# Patient Record
Sex: Female | Born: 1975 | Race: Black or African American | Hispanic: No | Marital: Married | Smoking: Never smoker
Health system: Southern US, Community
[De-identification: ages and names within clinical notes are randomized; demographics above are authoritative.]

## PROBLEM LIST (undated history)

## (undated) HISTORY — PX: BREAST EXCISIONAL BIOPSY: SUR124

## (undated) HISTORY — PX: ABDOMINAL HYSTERECTOMY: SHX81

## (undated) HISTORY — PX: OTHER SURGICAL HISTORY: SHX169

---

## 1999-11-10 ENCOUNTER — Other Ambulatory Visit: Admission: RE | Admit: 1999-11-10 | Discharge: 1999-11-10 | Payer: Self-pay | Admitting: Obstetrics and Gynecology

## 2000-07-04 ENCOUNTER — Emergency Department (HOSPITAL_COMMUNITY): Admission: EM | Admit: 2000-07-04 | Discharge: 2000-07-04 | Payer: Self-pay | Admitting: Emergency Medicine

## 2000-11-24 ENCOUNTER — Emergency Department (HOSPITAL_COMMUNITY): Admission: EM | Admit: 2000-11-24 | Discharge: 2000-11-24 | Payer: Self-pay

## 2000-12-29 ENCOUNTER — Other Ambulatory Visit: Admission: RE | Admit: 2000-12-29 | Discharge: 2000-12-29 | Payer: Self-pay | Admitting: Obstetrics & Gynecology

## 2000-12-29 ENCOUNTER — Other Ambulatory Visit: Admission: RE | Admit: 2000-12-29 | Discharge: 2000-12-29 | Payer: Self-pay | Admitting: *Deleted

## 2001-01-31 ENCOUNTER — Observation Stay (HOSPITAL_COMMUNITY): Admission: AD | Admit: 2001-01-31 | Discharge: 2001-02-01 | Payer: Self-pay | Admitting: *Deleted

## 2001-07-21 ENCOUNTER — Inpatient Hospital Stay (HOSPITAL_COMMUNITY): Admission: AD | Admit: 2001-07-21 | Discharge: 2001-07-23 | Payer: Self-pay | Admitting: Obstetrics and Gynecology

## 2001-07-21 ENCOUNTER — Encounter (INDEPENDENT_AMBULATORY_CARE_PROVIDER_SITE_OTHER): Payer: Self-pay | Admitting: Specialist

## 2001-08-20 ENCOUNTER — Inpatient Hospital Stay (HOSPITAL_COMMUNITY): Admission: AD | Admit: 2001-08-20 | Discharge: 2001-08-20 | Payer: Self-pay | Admitting: Obstetrics & Gynecology

## 2001-09-21 ENCOUNTER — Other Ambulatory Visit: Admission: RE | Admit: 2001-09-21 | Discharge: 2001-09-21 | Payer: Self-pay | Admitting: Obstetrics & Gynecology

## 2004-10-28 ENCOUNTER — Encounter: Admission: RE | Admit: 2004-10-28 | Discharge: 2004-10-28 | Payer: Self-pay | Admitting: Obstetrics & Gynecology

## 2006-06-20 ENCOUNTER — Emergency Department (HOSPITAL_COMMUNITY): Admission: EM | Admit: 2006-06-20 | Discharge: 2006-06-20 | Payer: Self-pay | Admitting: Emergency Medicine

## 2006-06-24 ENCOUNTER — Emergency Department (HOSPITAL_COMMUNITY): Admission: EM | Admit: 2006-06-24 | Discharge: 2006-06-24 | Payer: Self-pay | Admitting: Family Medicine

## 2008-12-20 ENCOUNTER — Encounter (INDEPENDENT_AMBULATORY_CARE_PROVIDER_SITE_OTHER): Payer: Self-pay | Admitting: Obstetrics and Gynecology

## 2008-12-20 ENCOUNTER — Ambulatory Visit (HOSPITAL_COMMUNITY): Admission: RE | Admit: 2008-12-20 | Discharge: 2008-12-20 | Payer: Self-pay | Admitting: Obstetrics and Gynecology

## 2009-10-20 ENCOUNTER — Emergency Department (HOSPITAL_COMMUNITY): Admission: EM | Admit: 2009-10-20 | Discharge: 2009-10-20 | Payer: Self-pay | Admitting: Emergency Medicine

## 2010-02-13 ENCOUNTER — Ambulatory Visit (HOSPITAL_COMMUNITY): Admission: RE | Admit: 2010-02-13 | Discharge: 2010-02-14 | Payer: Self-pay | Admitting: Obstetrics and Gynecology

## 2010-02-13 ENCOUNTER — Encounter (INDEPENDENT_AMBULATORY_CARE_PROVIDER_SITE_OTHER): Payer: Self-pay | Admitting: Obstetrics and Gynecology

## 2010-09-07 ENCOUNTER — Emergency Department (HOSPITAL_COMMUNITY)
Admission: EM | Admit: 2010-09-07 | Discharge: 2010-09-07 | Payer: Self-pay | Source: Home / Self Care | Admitting: Emergency Medicine

## 2010-12-08 LAB — URINALYSIS, ROUTINE W REFLEX MICROSCOPIC
Hgb urine dipstick: NEGATIVE
Urobilinogen, UA: 1 mg/dL (ref 0.0–1.0)

## 2010-12-08 LAB — CBC
MCH: 28.3 pg (ref 26.0–34.0)
Platelets: 205 10*3/uL (ref 150–400)
RBC: 4.06 MIL/uL (ref 3.87–5.11)
RDW: 13.5 % (ref 11.5–15.5)
WBC: 9.7 10*3/uL (ref 4.0–10.5)

## 2010-12-08 LAB — COMPREHENSIVE METABOLIC PANEL
ALT: 11 U/L (ref 0–35)
AST: 17 U/L (ref 0–37)
Alkaline Phosphatase: 71 U/L (ref 39–117)
BUN: 13 mg/dL (ref 6–23)
Calcium: 8.9 mg/dL (ref 8.4–10.5)
Creatinine, Ser: 0.98 mg/dL (ref 0.4–1.2)
GFR calc non Af Amer: >60 mL/min (ref >60)
Sodium: 138 mEq/L (ref 135–145)
Total Bilirubin: 0.5 mg/dL (ref 0.3–1.2)

## 2010-12-08 LAB — DIFFERENTIAL
Lymphocytes Relative: 33 % (ref 12–46)
Monocytes Absolute: 0.6 10*3/uL (ref 0.1–1.0)
Monocytes Relative: 6 % (ref 3–12)
Neutro Abs: 5.8 10*3/uL (ref 1.7–7.7)

## 2010-12-08 LAB — POCT PREGNANCY, URINE: Preg Test, Ur: NEGATIVE

## 2010-12-14 LAB — CBC
HCT: 37.5 % (ref 36.0–46.0)
Hemoglobin: 11.3 g/dL — ABNORMAL LOW (ref 12.0–15.0)
Hemoglobin: 12.6 g/dL (ref 12.0–15.0)
MCHC: 33.6 g/dL (ref 30.0–36.0)
MCHC: 33.7 g/dL (ref 30.0–36.0)
MCV: 87.1 fL (ref 78.0–100.0)
RBC: 4.31 MIL/uL (ref 3.87–5.11)
RDW: 12.8 % (ref 11.5–15.5)
WBC: 18.4 10*3/uL — ABNORMAL HIGH (ref 4.0–10.5)

## 2010-12-14 LAB — COMPREHENSIVE METABOLIC PANEL
ALT: 13 U/L (ref 0–35)
Alkaline Phosphatase: 64 U/L (ref 39–117)
Creatinine, Ser: 0.87 mg/dL (ref 0.4–1.2)
GFR calc Af Amer: >60 mL/min (ref >60)
Glucose, Bld: 85 mg/dL (ref 70–99)
Total Bilirubin: 0.6 mg/dL (ref 0.3–1.2)

## 2010-12-14 LAB — HCG, SERUM, QUALITATIVE: Preg, Serum: NEGATIVE

## 2011-01-07 LAB — CBC
MCHC: 33.1 g/dL (ref 30.0–36.0)
RDW: 15.1 % (ref 11.5–15.5)

## 2011-01-07 LAB — HCG, SERUM, QUALITATIVE: Preg, Serum: NEGATIVE

## 2011-02-09 NOTE — Op Note (Signed)
[name], [name]               ACCOUNT NO.:  [id number]   MEDICAL RECORD NO.:  [id number]          PATIENT TYPE:  AMB   LOCATION:  SDC                           FACILITY:  [facility]   PHYSICIAN:  Lenoard Aden, M.D.DATE OF BIRTH:  1976/08/30   DATE OF PROCEDURE:  12/20/2008  DATE OF DISCHARGE:                               OPERATIVE REPORT   PREOPERATIVE DIAGNOSIS:  Menorrhagia.   POSTOPERATIVE DIAGNOSIS:  Menorrhagia.   PROCEDURES:  Diagnostic hysteroscopy, dilation and curettage, and  NovaSure endometrial ablation.   SURGEON:  Lenoard Aden, MD   ANESTHESIA:  General and local.   ESTIMATED BLOOD LOSS:  Less than 50 mL.   FLUID DEFICIT:  50 mL.   COMPLICATIONS:  None.   DRAINS:  None.   COUNTS:  Correct.   The patient recovered in good condition.   SPECIMEN:  Endometrial curettings to Pathology.   BRIEF OPERATIVE NOTE:  After being apprised the risks of anesthesia,  infection, bleeding, uterine perforation, and possible need for repair,  the patient was brought to the operating room where she was administered  general anesthetic without complications, prepped and draped in the  usual sterile fashion.  Feet were placed in the Yellofin stirrups.  Exam  under anesthesia revealed a bulky anteflexed uterus and no adnexal  masses.  Cervix was easily dilated up to #25 [facility] dilator.  Cervical  length of 3.  Uterine sounds to 9.  Hysteroscope after dilute Pitressin  and Marcaine solutions was placed.  Pitressin solution at 3 and 9  o'clock at the cervicovaginal junction 16 mL total.  Marcaine solution  20 mL standard paracervical block at 4 and 8 o'clock.  At this time,  hysteroscope has been placed.  Visualization reveals an otherwise  normal, but thickened endometrial cavity.  Hysteroscope was removed.  Endometrial curettings were collected in a normal 4-quadrant method  using sharp curettage.  Repeat hysteroscopy reveals an otherwise normal  cavity with normal  tubal ostia.  At this time, NovaSure device was  placed, seated in the appropriate fashion to a width of 4.5, and CO2  test was initiated and is negative.  At this time, NovaSure device was  engaged to a power of 149 for 90 seconds and removed without difficulty  at the end of the procedure.  Visualization  then reveals an otherwise normal, but well ablated endometrial cavity  except for a small stripe of endometrium along the fundus.  Instrument  was then removed noting no evidence of uterine perforation.  The patient  is awakened,  she tolerated the procedure well and was transferred to  recovery in good condition.      Lenoard Aden, M.D.  Electronically Signed     RJT/MEDQ  D:  12/20/2008  T:  12/20/2008  Job:  [id number]

## 2011-02-12 NOTE — H&P (Signed)
[facility]  Patient:    [name], [name] Visit Number: [id number] MRN: [id number]          Service Type: Attending:  Lenoard Aden, M.D. Dictated by:   Lenoard Aden, M.D. Adm. Date:  07/21/01   CC:         [facility]                         History and Physical  CHIEF COMPLAINT:              Spontaneous rupture of membranes at 7 a.m.  HISTORY OF PRESENT ILLNESS:   The patient is a 35 year old African-American female G2, P1 at 10 weeks who presents with spontaneous rupture of membranes.  ALLERGIES:                    None.  MEDICATIONS:                  Prenatal vitamins.  PREGNANCY HISTORY:            Pregnancy complicated by questionable IUGR with normal interval growth noted.  PAST OBSTETRICAL HISTORY:     An 8 pound 9 ounce female born 56.  History of benign breast lumpectomy.  History of broken finger with surgery.  FAMILY HISTORY:               Questionable thrombophilia and lupus and myocardial infarction.  PRENATAL LABORATORY DATA:     Blood type B-, Rh antibody negative, sickle cell trait negative.  Hemoglobin electrophoresis normal.  Rubella immune, hepatitis B surface antigen negative, HIV nonreactive.  GC and chlamydia negative.  GBS is positive.  PHYSICAL EXAMINATION:  GENERAL:                      She is a well-developed, well-nourished African-American female in no apparent distress.  HEENT:                        Normal.  LUNGS:                        Clear.  HEART:                        Regular rate and rhythm.  ABDOMEN:                      Soft, gravid, nontender.  Estimated fetal weight 7 pounds.  The cervix, per maternity admissions, is 1 cm, 70%, vertex, and -1.  IMPRESSION:                   1. A 39-week intrauterine pregnancy.                               2. History of intrauterine growth retardation.                               3. Rh negative.                               4. Spontaneous  rupture of membranes.  5. Group B Strep positive.  PLAN:                         Proceed with Pitocin augmentation.  Anticipate attempts at a vaginal delivery.  Penicillin intrapartum prophylaxis ordered. Dictated by:   Lenoard Aden, M.D. Attending:  Lenoard Aden, M.D. DD:  07/21/01 TD:  07/21/01 Job: [id number]

## 2011-02-12 NOTE — H&P (Signed)
[facility]  Patient:    [name], [name]                      MRN: [id number] Adm. Date:  [id number] Disc. Date: [id number] Attending:  Marina Gravel B                         History and Physical  CHIEF COMPLAINT:              Nausea, vomiting, first trimester pregnancy.  HISTORY OF PRESENT ILLNESS:   Patient is a 35 year old white female gravida 2, para 1 who was sent to maternity admissions with complaints of severe nausea/vomiting and associated headache.  She was treated with intravenous antibiotics per the hyperemesis protocol, however, she had continued problems with nausea and vomiting and a severe headache.  Patient was therefore admitted overnight for observation and continued IV fluids, antiemetics, and medication for the headache.  PAST MEDICAL HISTORY:         History of occasional headaches, otherwise negative.  PAST SURGICAL HISTORY:        Right breast lump removal, right finger surgery.  PAST OBSTETRICAL HISTORY:     Previous spontaneous vaginal delivery.  FAMILY HISTORY:               Noncontributory.  MEDICATIONS:                  Prenatal vitamins.  ALLERGIES:                    None.  SOCIAL HISTORY:               No alcohol, tobacco, or other drugs.  REVIEW OF SYSTEMS:            Otherwise noncontributory and is documented in the intake health history form.  PHYSICAL EXAMINATION  VITAL SIGNS:                  Temperature 97.8, pulse 90, respirations 20, blood pressure 138/66.  GENERAL:                      The patient is alert and oriented with no acute distress.  She does complain of a mild headache and continuing nausea and vomiting.  HEART:                        Regular rate and rhythm.  LUNGS:                        Clear to auscultation.  ABDOMEN:                      Soft, nontender.  PELVIC:                       Vaginal examination deferred.  ASSESSMENT:                   A 14 week intrauterine  pregnancy with hyperemesis and associated headache.  PLAN:                         Admission overnight for observation for intravenous fluids and antiemetics and intravenous Demerol for headache. DD:  02/18/01 TD:  02/18/01 Job: [id number]

## 2011-02-12 NOTE — Op Note (Signed)
[facility]  Patient:    AIJALON, DEMURO Visit Number: [id number] MRN: [id number]          Service Type: OBS Location: 910A 9130 01 Attending Physician:  Lenoard Aden Dictated by:   Silverio Lay, M.D. Proc. Date: 07/22/01 Admit Date:  07/21/2001 Discharge Date: 07/23/2001                             Operative Report  PREOPERATIVE DIAGNOSIS:       Desire for sterilization.  POSTOPERATIVE DIAGNOSIS:      Desire for sterilization.  PROCEDURE:                    Postpartum bilateral tubal ligation.  SURGEON:                      Silverio Lay, M.D.  ANESTHESIA:                   Epidural.  ESTIMATED BLOOD LOSS:         Minimal.  DESCRIPTION OF PROCEDURE:     After being informed of the planned procedure with the possible complications including bleeding, infection, bowel injury, irreversibility and failure rate of 1/250 to 1/500, informed consent was obtained.  The patient was taken to OR #4 and given epidural anesthesia through the already-placed labor catheter.  She was prepped and draped in a sterile fashion.  After assessing an adequate level of anesthesia, we proceeded with an umbilical incision of 3 cm and bluntly dissected our way through the fascia.  The fascia was then grasped with Allis forceps and opened with scissors.  The peritoneum was opened bluntly.  We proceeded with tubal ligation on both sides with tying twice each pedicle with 0 chromic and removing 1.5 cm of tube.  Each pedicle was then cauterized with Bovie. Hemostasis was adequate.  We proceeded with closure of the fascia with a running suture of 0 Vicryl.  The skin was closed with subcuticular 4-0 Vicryl. Sponge and instrument counts were complete x 2.  Estimated blood loss was minimal.  The procedure was well tolerated by the patient, who was taken to the recovery room in well and stable condition. Dictated by:   Silverio Lay, M.D. Attending Physician:  Lenoard Aden DD:  07/22/01 TD:  07/24/01 Job: [id number]

## 2011-07-02 ENCOUNTER — Emergency Department (HOSPITAL_COMMUNITY): Payer: No Typology Code available for payment source

## 2011-07-02 ENCOUNTER — Inpatient Hospital Stay (HOSPITAL_COMMUNITY)
Admission: EM | Admit: 2011-07-02 | Discharge: 2011-07-04 | DRG: 494 | Disposition: A | Discharge status: Home / Self Care | Payer: No Typology Code available for payment source | Attending: Orthopaedic Surgery | Admitting: Orthopaedic Surgery

## 2011-07-02 DIAGNOSIS — Y92009 Unspecified place in unspecified non-institutional (private) residence as the place of occurrence of the external cause: Secondary | ICD-10-CM

## 2011-07-02 DIAGNOSIS — S82209A Unspecified fracture of shaft of unspecified tibia, initial encounter for closed fracture: Principal | ICD-10-CM | POA: Diagnosis present

## 2011-07-02 DIAGNOSIS — Y998 Other external cause status: Secondary | ICD-10-CM

## 2011-07-02 LAB — POCT I-STAT, CHEM 8
Calcium, Ion: 1.1 mmol/L — ABNORMAL LOW (ref 1.12–1.32)
Chloride: 105 mEq/L (ref 96–112)
Glucose, Bld: 133 mg/dL — ABNORMAL HIGH (ref 70–99)
HCT: 37 % (ref 36.0–46.0)
Hemoglobin: 12.6 g/dL (ref 12.0–15.0)
Potassium: 3.4 mEq/L — ABNORMAL LOW (ref 3.5–5.1)
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
## 2015-03-21 ENCOUNTER — Encounter (HOSPITAL_COMMUNITY): Payer: Self-pay | Admitting: Emergency Medicine

## 2015-03-21 ENCOUNTER — Emergency Department (HOSPITAL_COMMUNITY)
Admission: EM | Admit: 2015-03-21 | Discharge: 2015-03-22 | Disposition: A | Discharge status: Home / Self Care | Payer: BC Managed Care – PPO | Attending: Emergency Medicine | Admitting: Emergency Medicine

## 2015-03-21 ENCOUNTER — Emergency Department (HOSPITAL_COMMUNITY): Payer: BC Managed Care – PPO

## 2015-03-21 DIAGNOSIS — W228XXA Striking against or struck by other objects, initial encounter: Secondary | ICD-10-CM | POA: Insufficient documentation

## 2015-03-21 DIAGNOSIS — S6991XA Unspecified injury of right wrist, hand and finger(s), initial encounter: Secondary | ICD-10-CM | POA: Diagnosis present

## 2015-03-21 DIAGNOSIS — Y998 Other external cause status: Secondary | ICD-10-CM | POA: Insufficient documentation

## 2015-03-21 DIAGNOSIS — S61431A Puncture wound without foreign body of right hand, initial encounter: Secondary | ICD-10-CM | POA: Diagnosis not present

## 2015-03-21 DIAGNOSIS — Y9301 Activity, walking, marching and hiking: Secondary | ICD-10-CM | POA: Insufficient documentation

## 2015-03-21 DIAGNOSIS — Y9259 Other trade areas as the place of occurrence of the external cause: Secondary | ICD-10-CM | POA: Insufficient documentation

## 2015-03-21 DIAGNOSIS — Z23 Encounter for immunization: Secondary | ICD-10-CM | POA: Insufficient documentation
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
## 2015-03-22 DIAGNOSIS — S61431A Puncture wound without foreign body of right hand, initial encounter: Secondary | ICD-10-CM | POA: Diagnosis not present

## 2015-03-22 MED ORDER — TETANUS-DIPHTH-ACELL PERTUSSIS 5-2.5-18.5 LF-MCG/0.5 IM SUSP
0.5000 mL | Freq: Once | INTRAMUSCULAR | Status: AC
  Administered 2015-03-22: 0.5 mL via INTRAMUSCULAR
  Filled 2015-03-22: qty 0.5

## 2015-03-22 MED ORDER — CEPHALEXIN 500 MG PO CAPS: 500.0000 mg | ORAL_CAPSULE | Freq: Four times a day (QID) | ORAL | Status: DC

## 2015-03-22 MED ORDER — NAPROXEN 500 MG PO TABS: 500.0000 mg | ORAL_TABLET | Freq: Two times a day (BID) | ORAL | Status: DC

## 2015-03-22 NOTE — Discharge Instructions (Signed)
Puncture Wound °A puncture wound is an injury that extends through all layers of the skin and into the tissue beneath the skin (subcutaneous tissue). Puncture wounds become infected easily because germs often enter the body and go beneath the skin during the injury. Having a deep wound with a small entrance point makes it difficult for your caregiver to adequately clean the wound. This is especially true if you have stepped on a nail and it has passed through a dirty shoe or other situations where the wound is obviously contaminated. °CAUSES  °Many puncture wounds involve glass, nails, splinters, fish hooks, or other objects that enter the skin (foreign bodies). A puncture wound may also be caused by a human bite or animal bite. °DIAGNOSIS  °A puncture wound is usually diagnosed by your history and a physical exam. You may need to have an X-ray or an ultrasound to check for any foreign bodies still in the wound. °TREATMENT  °· Your caregiver will clean the wound as thoroughly as possible. Depending on the location of the wound, a bandage (dressing) may be applied. °· Your caregiver might prescribe antibiotic medicines. °· You may need a follow-up visit to check on your wound. Follow all instructions as directed by your caregiver. °HOME CARE INSTRUCTIONS  °· Change your dressing once per day, or as directed by your caregiver. If the dressing sticks, it may be removed by soaking the area in water. °· If your caregiver has given you follow-up instructions, it is very important that you return for a follow-up appointment. Not following up as directed could result in a chronic or permanent injury, pain, and disability. °· Only take over-the-counter or prescription medicines for pain, discomfort, or fever as directed by your caregiver. °· If you are given antibiotics, take them as directed. Finish them even if you start to feel better. °You may need a tetanus shot if: °· You cannot remember when you had your last tetanus  shot. °· You have never had a tetanus shot. °If you got a tetanus shot, your arm may swell, get red, and feel warm to the touch. This is common and not a problem. If you need a tetanus shot and you choose not to have one, there is a rare chance of getting tetanus. Sickness from tetanus can be serious. °You may need a rabies shot if an animal bite caused your puncture wound. °SEEK MEDICAL CARE IF:  °· You have redness, swelling, or increasing pain in the wound. °· You have red streaks going away from the wound. °· You notice a bad smell coming from the wound or dressing. °· You have yellowish-white fluid (pus) coming from the wound. °· You are treated with an antibiotic for infection, but the infection is not getting better. °· You notice something in the wound, such as rubber from your shoe, cloth, or another object. °· You have a fever. °· You have severe pain. °· You have difficulty breathing. °· You feel dizzy or faint. °· You cannot stop vomiting. °· You lose feeling, develop numbness, or cannot move a limb below the wound. °· Your symptoms worsen. °MAKE SURE YOU: °· Understand these instructions. °· Will watch your condition. °· Will get help right away if you are not doing well or get worse. °Document Released: 06/23/2005 Document Revised: 12/06/2011 Document Reviewed: 03/02/2011 °Exit[employer]® Patient Information ©2015 [employer]. This information is not intended to replace advice given to you by your health care provider. Make sure you discuss any questions you   have with your health care provider. ° °

## 2015-08-03 ENCOUNTER — Emergency Department (HOSPITAL_COMMUNITY)
Admission: EM | Admit: 2015-08-03 | Discharge: 2015-08-03 | Disposition: A | Discharge status: Home / Self Care | Payer: BC Managed Care – PPO | Attending: Physician Assistant | Admitting: Physician Assistant

## 2015-08-03 ENCOUNTER — Encounter (HOSPITAL_COMMUNITY): Payer: Self-pay | Admitting: Emergency Medicine

## 2015-08-03 DIAGNOSIS — H9201 Otalgia, right ear: Secondary | ICD-10-CM | POA: Diagnosis present

## 2015-08-03 DIAGNOSIS — Z791 Long term (current) use of non-steroidal anti-inflammatories (NSAID): Secondary | ICD-10-CM | POA: Insufficient documentation

## 2015-08-03 DIAGNOSIS — R590 Localized enlarged lymph nodes: Secondary | ICD-10-CM | POA: Diagnosis not present

## 2015-08-03 DIAGNOSIS — Z792 Long term (current) use of antibiotics: Secondary | ICD-10-CM | POA: Insufficient documentation

## 2015-08-03 MED ORDER — AZITHROMYCIN 250 MG PO TABS: 250.0000 mg | ORAL_TABLET | Freq: Every day | ORAL | Status: DC

## 2015-08-03 MED ORDER — CETIRIZINE HCL 10 MG PO CAPS: 10.0000 mg | ORAL_CAPSULE | Freq: Every day | ORAL | Status: DC

## 2015-08-03 MED ORDER — IBUPROFEN 800 MG PO TABS
800.0000 mg | ORAL_TABLET | Freq: Once | ORAL | Status: AC
  Administered 2015-08-03: 800 mg via ORAL
  Filled 2015-08-03: qty 1

## 2015-08-03 MED ORDER — AZITHROMYCIN 250 MG PO TABS
500.0000 mg | ORAL_TABLET | Freq: Once | ORAL | Status: AC
  Administered 2015-08-03: 500 mg via ORAL
  Filled 2015-08-03: qty 2

## 2015-08-03 NOTE — ED Provider Notes (Signed)
CSN: [id number]     Arrival date & time 08/03/15  1910 History  By signing my name below, I, Soijett Blue, attest that this documentation has been prepared under the direction and in the presence of Elpidio Anis, PA-C Electronically Signed: Soijett Blue, ED Scribe. 08/03/2015. 7:53 PM.   Chief Complaint  Patient presents with  . Otalgia     The history is provided by the patient. No language interpreter was used.    [name] is a 39 y.o. female who presents to the Emergency Department complaining of constant, 6/10, throbbing, right ear pain onset 1 day. She thinks that she has an ear infection and she has not had one in the past. She notes that she has not tried any medications for the relief of her symptoms. She denies fever, chills, ear drainage, cough, nasal congestion, and any other symptoms. Denies allergies to medications.   History reviewed. No pertinent past medical history. Past Surgical History  Procedure Laterality Date  . Partial hysterectomy     No family history on file. Social History  Substance Use Topics  . Smoking status: Never Smoker   . Smokeless tobacco: None  . Alcohol Use: No   OB History    No data available     Review of Systems  Constitutional: Negative for fever and chills.  HENT: Positive for ear pain. Negative for congestion, ear discharge and sinus pressure.   Respiratory: Negative for cough.      Allergies  Review of patient's allergies indicates no known allergies.  Home Medications   Prior to Admission medications   Medication Sig Start Date End Date Taking? Authorizing Provider  cephALEXin (KEFLEX) 500 MG capsule Take 1 capsule (500 mg total) by mouth 4 (four) times daily. 03/22/15   Elpidio Anis, PA-C  naproxen (NAPROSYN) 500 MG tablet Take 1 tablet (500 mg total) by mouth 2 (two) times daily. 03/22/15   Tallulah Hosman, PA-C   BP 142/63 mmHg  Pulse 89  Temp(Src) 98.5 F (36.9 C) (Oral)  Resp 20  SpO2 100% Physical Exam   Constitutional: She is oriented to person, place, and time. She appears well-developed and well-nourished. No distress.  HENT:  Head: Normocephalic and atraumatic.  Left Ear: Tympanic membrane and ear canal normal.  Right TM: erythematous markings. No purulent middle ear effusion. External canal is unremarkable. No pain with ear movment. Left TM nl.  Eyes: EOM are normal.  Neck: Neck supple.  Cardiovascular: Normal rate.   Pulmonary/Chest: Effort normal. No respiratory distress.  Musculoskeletal: Normal range of motion.  Lymphadenopathy:    She has cervical adenopathy.  Neurological: She is alert and oriented to person, place, and time.  Skin: Skin is warm and dry.  Psychiatric: She has a normal mood and affect. Her behavior is normal.  Nursing note and vitals reviewed.   ED Course  Procedures (including critical care time) DIAGNOSTIC STUDIES: Oxygen Saturation is 100% on RA, nl by my interpretation.    COORDINATION OF CARE: 7:53 PM Discussed treatment plan with pt at bedside which includes zithromax Rx and use zyrtec or benadryl PRN and pt agreed to plan.   Labs Review Labs Reviewed - No data to display  Imaging Review No results found.    EKG Interpretation None      MDM   Final diagnoses:  None    1. Right otalgia  Melvenia Beam has symptoms of ear pain without finding of middle ear infection. Consider sinus or allergy component. Will opt to  treat with Zyrtec, Z-pack, PCP follow up.  I personally performed the services described in this documentation, which was scribed in my presence. The recorded information has been reviewed and is accurate.     Elpidio AnisShari Riane Rung, PA-C 08/07/15 2319  Courteney Randall AnLyn Mackuen, MD 08/09/15 [phone]

## 2015-08-03 NOTE — Discharge Instructions (Signed)
Earache An earache, also called otalgia, can be caused by many things. Pain from an earache can be sharp, dull, or burning. The pain may be temporary or constant. Earaches can be caused by problems with the ear, such as infection in either the middle ear or the ear canal, injury, impacted ear wax, middle ear pressure, or a foreign body in the ear. Ear pain can also result from problems in other areas. This is called referred pain. For example, pain can come from a sore throat, a tooth infection, or problems with the jaw or the joint between the jaw and the skull (temporomandibular joint, or TMJ). The cause of an earache is not always easy to identify. Watchful waiting may be appropriate for some earaches until a clear cause of the pain can be found. HOME CARE INSTRUCTIONS Watch your condition for any changes. The following actions may help to lessen any discomfort that you are feeling:  Take medicines only as directed by your health care provider. This includes ear drops.  Apply ice to your outer ear to help reduce pain.  Put ice in a plastic bag.  Place a towel between your skin and the bag.  Leave the ice on for 20 minutes, 2-3 times per day.  Do not put anything in your ear other than medicine that is prescribed by your health care provider.  Try resting in an upright position instead of lying down. This may help to reduce pressure in the middle ear and relieve pain.  Chew gum if it helps to relieve your ear pain.  Control any allergies that you have.  Keep all follow-up visits as directed by your health care provider. This is important. SEEK MEDICAL CARE IF:  Your pain does not improve within 2 days.  You have a fever.  You have new or worsening symptoms. SEEK IMMEDIATE MEDICAL CARE IF:  You have a severe headache.  You have a stiff neck.  You have difficulty swallowing.  You have redness or swelling behind your ear.  You have drainage from your ear.  You have hearing  loss.  You feel dizzy.   This information is not intended to replace advice given to you by your health care provider. Make sure you discuss any questions you have with your health care provider.   Document Released: 04/30/2004 Document Revised: 10/04/2014 Document Reviewed: 04/14/2014 Elsevier Interactive Patient Education 2016 [employer]  

## 2015-08-03 NOTE — ED Notes (Signed)
Patient c/o right ear pain x1 day, described at throbbing, no drainage. Denies fever/chills. Rates pain 6/10.

## 2016-01-12 ENCOUNTER — Encounter (HOSPITAL_BASED_OUTPATIENT_CLINIC_OR_DEPARTMENT_OTHER): Payer: Self-pay | Admitting: Emergency Medicine

## 2016-01-12 ENCOUNTER — Emergency Department (HOSPITAL_BASED_OUTPATIENT_CLINIC_OR_DEPARTMENT_OTHER)
Admission: EM | Admit: 2016-01-12 | Discharge: 2016-01-12 | Disposition: A | Discharge status: Home / Self Care | Payer: BC Managed Care – PPO | Attending: Emergency Medicine | Admitting: Emergency Medicine

## 2016-01-12 ENCOUNTER — Emergency Department (HOSPITAL_BASED_OUTPATIENT_CLINIC_OR_DEPARTMENT_OTHER): Payer: BC Managed Care – PPO

## 2016-01-12 DIAGNOSIS — R51 Headache: Secondary | ICD-10-CM | POA: Insufficient documentation

## 2016-01-12 DIAGNOSIS — R519 Headache, unspecified: Secondary | ICD-10-CM

## 2016-01-12 DIAGNOSIS — R42 Dizziness and giddiness: Secondary | ICD-10-CM | POA: Diagnosis not present

## 2016-01-12 NOTE — ED Notes (Signed)
Pt having headache over one week.  Pt taking otc medications that help but once medications wear off the headache comes back.  Pt also having dizziness, feels like she is in a fog.  Pt states it is having tremors and difficulty to open wide.  No weakness in extremeties.  Some swelling noted per pt which is new.  No vision changes.  No n/v.  Some sensitivity to light.

## 2016-01-12 NOTE — ED Provider Notes (Signed)
CSN: [id number]     Arrival date & time 01/12/16  1005 History   First MD Initiated Contact with Patient 01/12/16 1059     Chief Complaint  Patient presents with  . Headache     (Consider location/radiation/quality/duration/timing/severity/associated sxs/prior Treatment) HPI Comments: Patient is a 40 year old female with no significant past medical history. She presents for evaluation of headache. This started 1 week ago and has not let up. She denies any injury or trauma. She denies any visual disturbances. She denies any nausea. She denies any fevers or stiff neck.  Patient is a 40 y.o. female presenting with headaches. The history is provided by the patient.  Headache Pain location:  Frontal Quality: Throbbing. Radiates to:  Does not radiate Duration:  1 week Timing:  Constant Progression:  Worsening Chronicity:  New Similar to prior headaches: no   Context: bright light   Context: not activity and not caffeine   Relieved by:  Nothing Worsened by:  Nothing Ineffective treatments:  Acetaminophen and NSAIDs Associated symptoms: no blurred vision, no fever and no neck stiffness     No past medical history on file. Past Surgical History  Procedure Laterality Date  . Abdominal hysterectomy    . Arm surgery     No family history on file. Social History  Substance Use Topics  . Smoking status: Never Smoker   . Smokeless tobacco: None  . Alcohol Use: No   OB History    No data available     Review of Systems  Constitutional: Negative for fever.  Eyes: Negative for blurred vision.  Musculoskeletal: Negative for neck stiffness.  Neurological: Positive for headaches.  All other systems reviewed and are negative.     Allergies  Review of patient's allergies indicates no known allergies.  Home Medications   Prior to Admission medications   Not on File   BP 122/74 mmHg  Pulse 66  Temp(Src) 98.4 F (36.9 C) (Oral)  Resp 16  Ht 5\' 4"  (1.626 m)  SpO2  100% Physical Exam  Constitutional: She is oriented to person, place, and time. She appears well-developed and well-nourished. No distress.  HENT:  Head: Normocephalic and atraumatic.  Eyes: EOM are normal. Pupils are equal, round, and reactive to light.  There is no papilledema on funduscopic exam  Neck: Normal range of motion. Neck supple.  Cardiovascular: Normal rate and regular rhythm.  Exam reveals no gallop and no friction rub.   No murmur heard. Pulmonary/Chest: Effort normal and breath sounds normal. No respiratory distress. She has no wheezes.  Abdominal: Soft. Bowel sounds are normal. She exhibits no distension. There is no tenderness.  Musculoskeletal: Normal range of motion.  Neurological: She is alert and oriented to person, place, and time. No cranial nerve deficit. She exhibits normal muscle tone. Coordination normal.  Skin: Skin is warm and dry. She is not diaphoretic.  Nursing note and vitals reviewed.   ED Course  Procedures (including critical care time) Labs Review Labs Reviewed - No data to display  Imaging Review No results found. I have personally reviewed and evaluated these images and lab results as part of my medical decision-making.    MDM   Final diagnoses:  None    Patient is neurologically intact and head ct is unremarkable.  Highly doubt meningitis or bleed.  No indication for LP at this time.  Will discharge with alternating motrin and tylenol.    Geoffery Lyonsouglas Aunna Snooks, MD 01/12/16 [phone]

## 2016-01-12 NOTE — Discharge Instructions (Signed)
Tylenol 1000 mg her dictated with Motrin 600 mg every 4 hours as needed for pain.  Avoid caffeine and alcohol intake.  Follow-up with your primary Dr. if not improving in the next week.   General Headache Without Cause A headache is pain or discomfort felt around the head or neck area. The specific cause of a headache may not be found. There are many causes and types of headaches. A few common ones are:  Tension headaches.  Migraine headaches.  Cluster headaches.  Chronic daily headaches. HOME CARE INSTRUCTIONS  Watch your condition for any changes. Take these steps to help with your condition: Managing Pain  Take over-the-counter and prescription medicines only as told by your health care provider.  Lie down in a dark, quiet room when you have a headache.  If directed, apply ice to the head and neck area:  Put ice in a plastic bag.  Place a towel between your skin and the bag.  Leave the ice on for 20 minutes, 2-3 times per day.  Use a heating pad or hot shower to apply heat to the head and neck area as told by your health care provider.  Keep lights dim if bright lights bother you or make your headaches worse. Eating and Drinking  Eat meals on a regular schedule.  Limit alcohol use.  Decrease the amount of caffeine you drink, or stop drinking caffeine. General Instructions  Keep all follow-up visits as told by your health care provider. This is important.  Keep a headache journal to help find out what may trigger your headaches. For example, write down:  What you eat and drink.  How much sleep you get.  Any change to your diet or medicines.  Try massage or other relaxation techniques.  Limit stress.  Sit up straight, and do not tense your muscles.  Do not use tobacco products, including cigarettes, chewing tobacco, or e-cigarettes. If you need help quitting, ask your health care provider.  Exercise regularly as told by your health care  provider.  Sleep on a regular schedule. Get 7-9 hours of sleep, or the amount recommended by your health care provider. SEEK MEDICAL CARE IF:   Your symptoms are not helped by medicine.  You have a headache that is different from the usual headache.  You have nausea or you vomit.  You have a fever. SEEK IMMEDIATE MEDICAL CARE IF:   Your headache becomes severe.  You have repeated vomiting.  You have a stiff neck.  You have a loss of vision.  You have problems with speech.  You have pain in the eye or ear.  You have muscular weakness or loss of muscle control.  You lose your balance or have trouble walking.  You feel faint or pass out.  You have confusion.   This information is not intended to replace advice given to you by your health care provider. Make sure you discuss any questions you have with your health care provider.   Document Released: 09/13/2005 Document Revised: 06/04/2015 Document Reviewed: 01/06/2015 Elsevier Interactive Patient Education [employer] Inc[employer]

## 2019-04-17 ENCOUNTER — Other Ambulatory Visit: Payer: Self-pay

## 2019-04-17 DIAGNOSIS — Z20822 Contact with and (suspected) exposure to covid-19: Secondary | ICD-10-CM

## 2019-04-19 LAB — NOVEL CORONAVIRUS, NAA: SARS-CoV-2, NAA: NOT DETECTED

## 2019-04-26 ENCOUNTER — Other Ambulatory Visit: Payer: Self-pay

## 2019-04-26 DIAGNOSIS — Z20822 Contact with and (suspected) exposure to covid-19: Secondary | ICD-10-CM

## 2019-05-01 LAB — NOVEL CORONAVIRUS, NAA: SARS-CoV-2, NAA: DETECTED — AB

## 2020-03-06 ENCOUNTER — Other Ambulatory Visit: Payer: Self-pay | Admitting: Obstetrics and Gynecology

## 2020-03-06 DIAGNOSIS — N644 Mastodynia: Secondary | ICD-10-CM

## 2020-03-06 DIAGNOSIS — N63 Unspecified lump in unspecified breast: Secondary | ICD-10-CM

## 2020-03-21 ENCOUNTER — Ambulatory Visit
Admission: RE | Admit: 2020-03-21 | Discharge: 2020-03-21 | Disposition: A | Discharge status: Home / Self Care | Payer: BC Managed Care – PPO | Source: Ambulatory Visit | Attending: Obstetrics and Gynecology | Admitting: Obstetrics and Gynecology

## 2020-03-21 ENCOUNTER — Ambulatory Visit
Admission: RE | Admit: 2020-03-21 | Discharge: 2020-03-21 | Disposition: A | Discharge status: Home / Self Care | Payer: PRIVATE HEALTH INSURANCE | Source: Ambulatory Visit | Attending: Obstetrics and Gynecology | Admitting: Obstetrics and Gynecology

## 2020-03-21 ENCOUNTER — Other Ambulatory Visit: Payer: Self-pay

## 2020-03-21 DIAGNOSIS — N63 Unspecified lump in unspecified breast: Secondary | ICD-10-CM

## 2020-03-21 DIAGNOSIS — N644 Mastodynia: Secondary | ICD-10-CM

## 2020-11-08 IMAGING — MG DIGITAL DIAGNOSTIC BILAT W/ TOMO W/ CAD
6 of 10 series · 6 of 30 positions shown · non-contrast
Comparison: Previous exam(s).

CLINICAL DATA: Patient has tenderness in the LATERAL portion of the
RIGHT breast. She palpates a mass in the UPPER central portion of
the RIGHT breast. History of benign excisional biopsy in the RIGHT
breast.

EXAM:
DIGITAL DIAGNOSTIC BILATERAL MAMMOGRAM WITH CAD AND TOMO
ULTRASOUND RIGHT BREAST

[R TAN synth-2D]
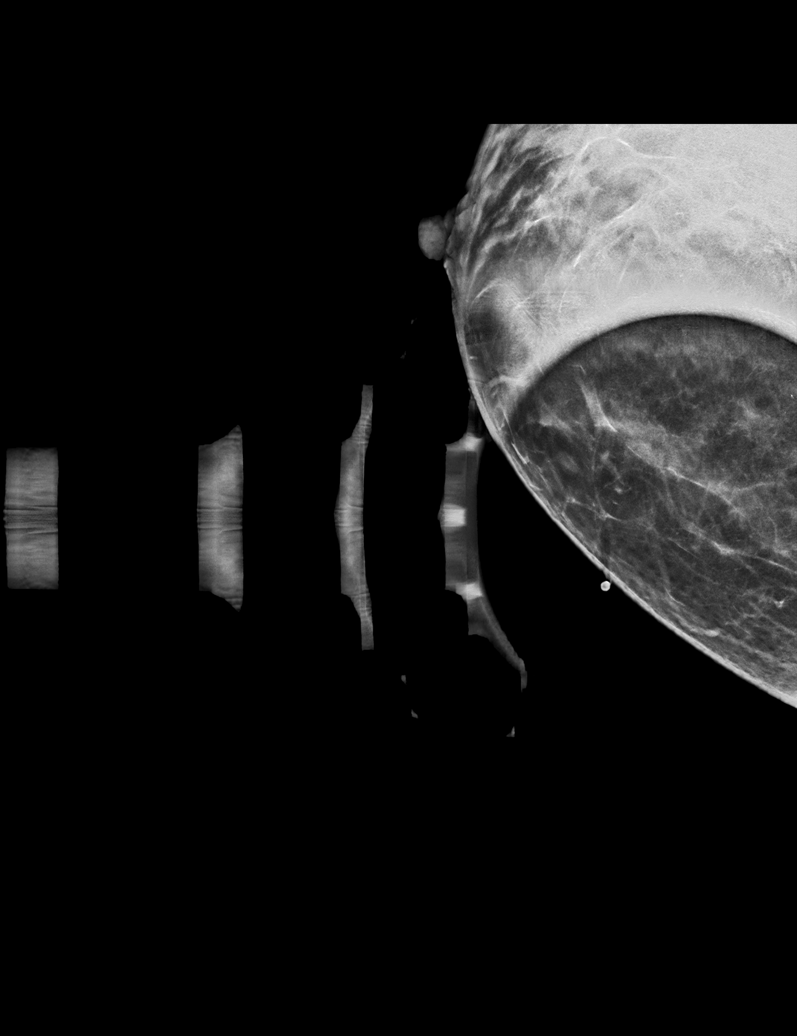

[R MLO synth-2D]
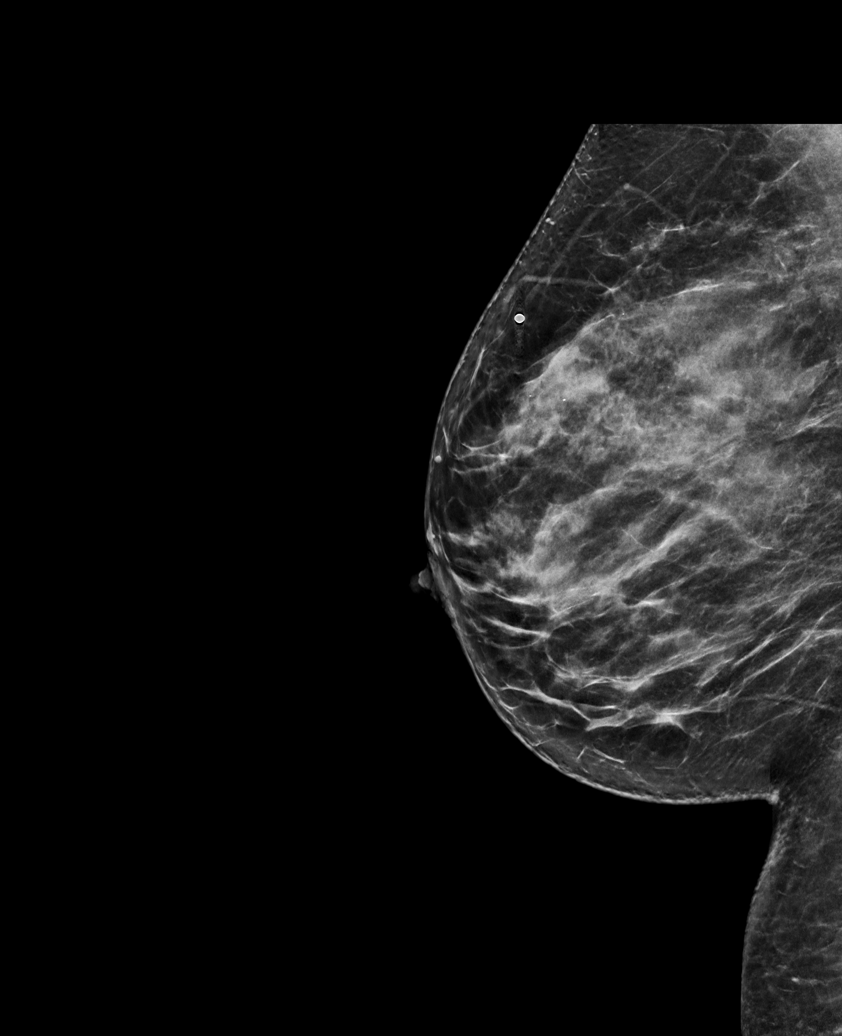

[R CC synth-2D]
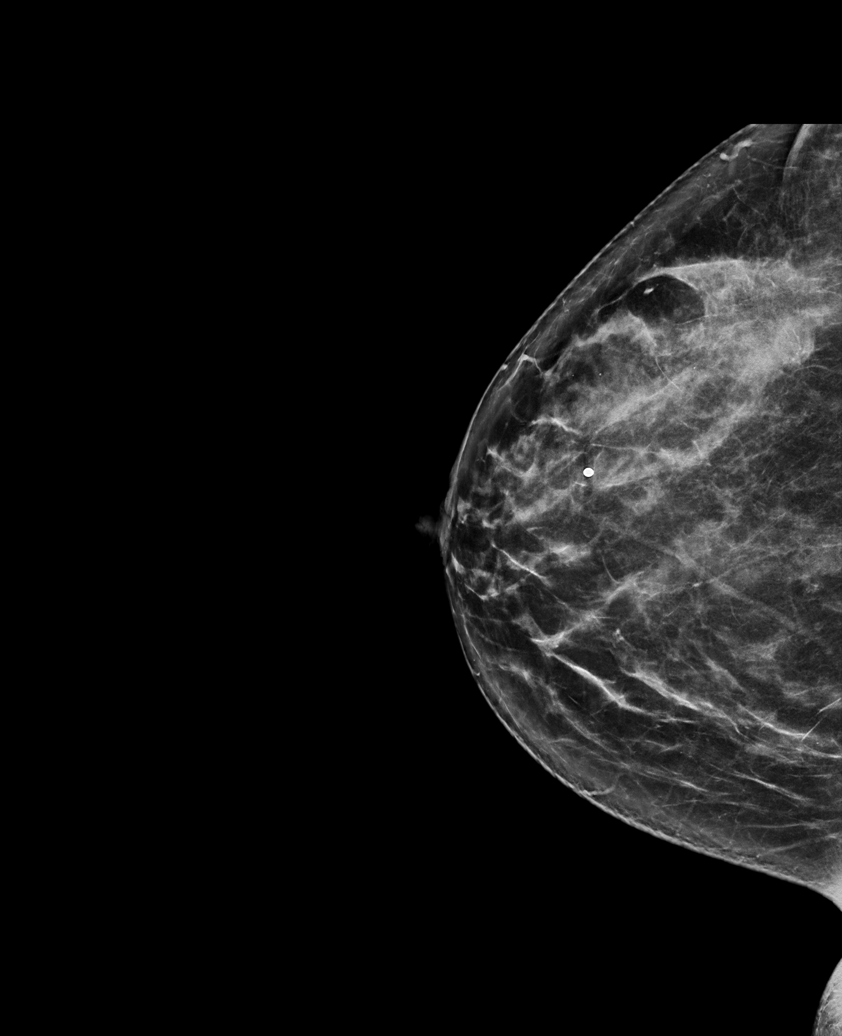

[L MLO synth-2D]
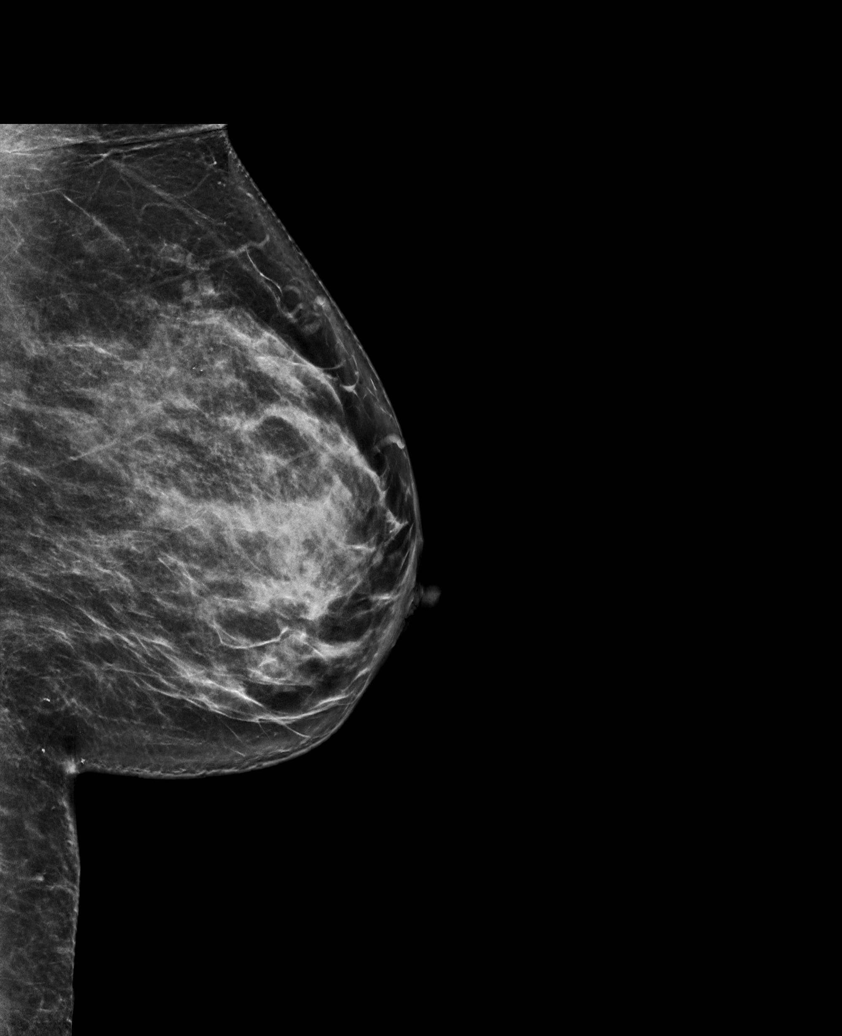

[L CC synth-2D]
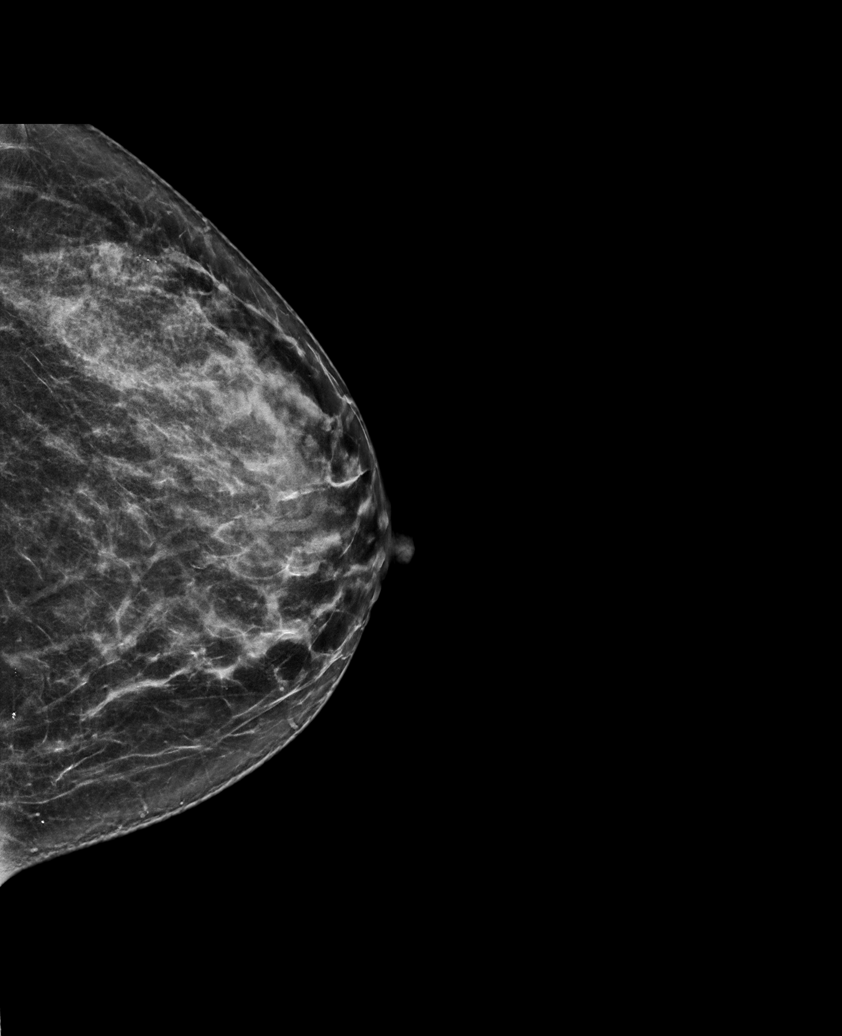

[R CC tomo · tomo slice 39/77.0]
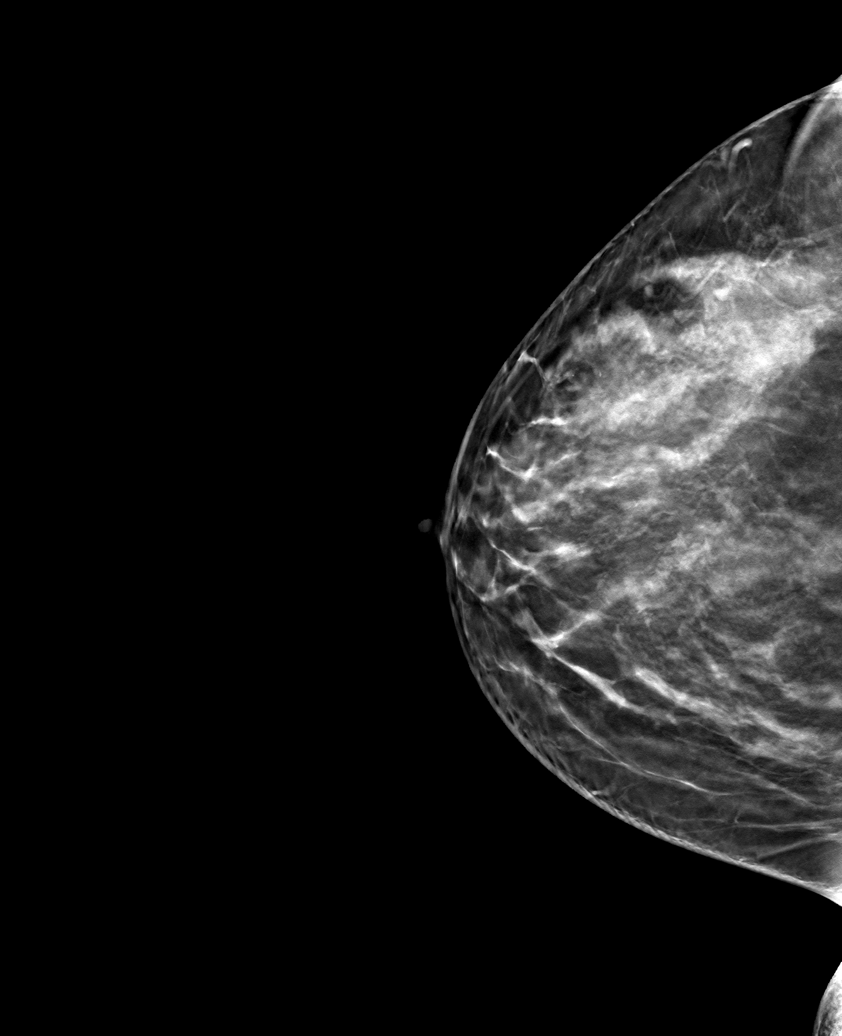

[6 of 30 positions shown; findings below may reference images not displayed]

ACR Breast Density Category c: The breast tissue is heterogeneously
dense, which may obscure small masses.
FINDINGS: Postoperative changes are identified in the LATERAL portion of the
RIGHT breast. No suspicious mass, distortion, or microcalcifications
are identified to suggest presence of malignancy. Spot tangential
view is performed in the area of patient's concern in the UPPER
portion of the RIGHT breast and is unremarkable.

Mammographic images were processed with CAD.

On physical exam, I palpate no discrete mass in the 12 o'clock
location of the RIGHT breast in the area of patient's concern. I do
palpate soft nonfocal thickening in this same region.

Targeted ultrasound is performed, showing normal appearing
fibroglandular tissue in the UPPER central portion of the RIGHT
breast in the area of concern. No suspicious mass, distortion, or
acoustic shadowing is demonstrated with ultrasound.
IMPRESSION: No mammographic or ultrasound evidence for malignancy.

RECOMMENDATION:
Screening mammogram in one year.(Code:MF-5-4QM)

I have discussed the findings and recommendations with the patient.
If applicable, a reminder letter will be sent to the patient
regarding the next appointment.

BI-RADS CATEGORY  1: Negative.
